# Patient Record
Sex: Female | Born: 1955 | Race: Black or African American | Hispanic: No | Marital: Married
Health system: Southern US, Community
[De-identification: ages and names within clinical notes are randomized; demographics above are authoritative.]

---

## 2004-03-11 ENCOUNTER — Ambulatory Visit (HOSPITAL_COMMUNITY): Admission: RE | Admit: 2004-03-11 | Discharge: 2004-03-11 | Payer: Self-pay | Admitting: Internal Medicine

## 2004-09-30 ENCOUNTER — Ambulatory Visit: Payer: Self-pay | Admitting: Orthopedic Surgery

## 2004-10-04 ENCOUNTER — Ambulatory Visit: Payer: Self-pay | Admitting: Family Medicine

## 2004-11-22 ENCOUNTER — Ambulatory Visit: Payer: Self-pay | Admitting: Family Medicine

## 2006-01-30 ENCOUNTER — Ambulatory Visit: Payer: Self-pay | Admitting: Family Medicine

## 2006-04-03 ENCOUNTER — Ambulatory Visit: Payer: Self-pay | Admitting: Family Medicine

## 2006-04-03 ENCOUNTER — Other Ambulatory Visit: Admission: RE | Admit: 2006-04-03 | Discharge: 2006-04-03 | Payer: Self-pay | Admitting: Family Medicine

## 2006-04-03 LAB — CONVERTED CEMR LAB
Pap Smear: NORMAL
Pap Smear: NORMAL

## 2006-11-11 ENCOUNTER — Ambulatory Visit: Payer: Self-pay | Admitting: Family Medicine

## 2009-02-01 ENCOUNTER — Encounter: Payer: Self-pay | Admitting: Family Medicine

## 2009-02-01 DIAGNOSIS — I10 Essential (primary) hypertension: Secondary | ICD-10-CM | POA: Insufficient documentation

## 2009-02-08 ENCOUNTER — Ambulatory Visit (HOSPITAL_COMMUNITY): Admission: RE | Admit: 2009-02-08 | Discharge: 2009-02-08 | Payer: Self-pay | Admitting: Family Medicine

## 2009-02-08 ENCOUNTER — Ambulatory Visit: Payer: Self-pay | Admitting: Family Medicine

## 2009-02-08 DIAGNOSIS — M25519 Pain in unspecified shoulder: Secondary | ICD-10-CM

## 2009-02-09 ENCOUNTER — Encounter: Payer: Self-pay | Admitting: Family Medicine

## 2009-02-09 LAB — CONVERTED CEMR LAB
Basophils Absolute: 0 10*3/uL (ref 0.0–0.1)
Chloride: 105 meq/L (ref 96–112)
Cholesterol: 158 mg/dL (ref 0–200)
HDL: 50 mg/dL (ref >39)
Lymphocytes Relative: 37 % (ref 12–46)
Lymphs Abs: 1.8 10*3/uL (ref 0.7–4.0)
Neutro Abs: 2.9 10*3/uL (ref 1.7–7.7)
Platelets: 315 10*3/uL (ref 150–400)
Potassium: 4.4 meq/L (ref 3.5–5.3)
RDW: 13.2 % (ref 11.5–15.5)
Sodium: 142 meq/L (ref 135–145)
Total CHOL/HDL Ratio: 3.2
Triglycerides: 106 mg/dL (ref <150)
VLDL: 21 mg/dL (ref 0–40)
WBC: 4.9 10*3/uL (ref 4.0–10.5)

## 2009-02-15 DIAGNOSIS — E663 Overweight: Secondary | ICD-10-CM | POA: Insufficient documentation

## 2009-12-20 IMAGING — CR DG SHOULDER 2+V*L*
3 series · 3 of 3 positions shown · non-contrast
Comparison: None

CLINICAL DATA: Left shoulder pain

LEFT SHOULDER - 2+ VIEW

[view not recorded (1 of 3)]
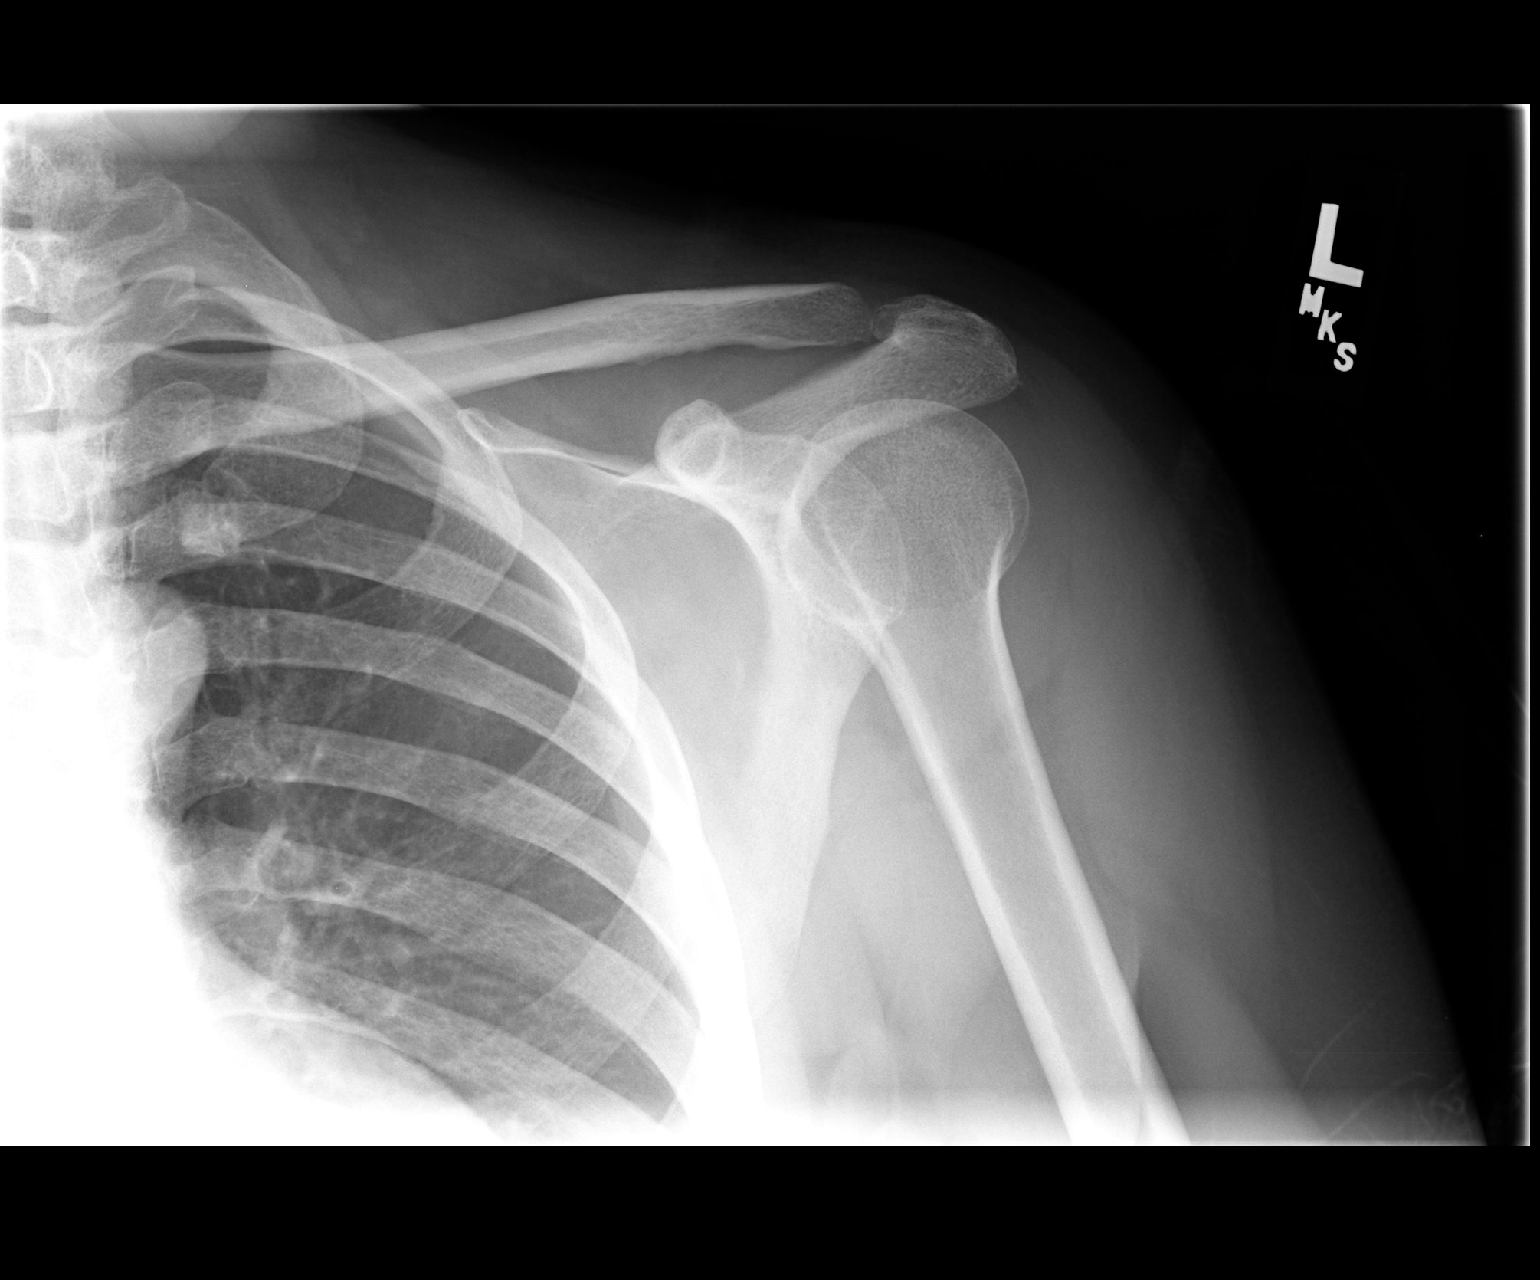

[view not recorded (2 of 3)]
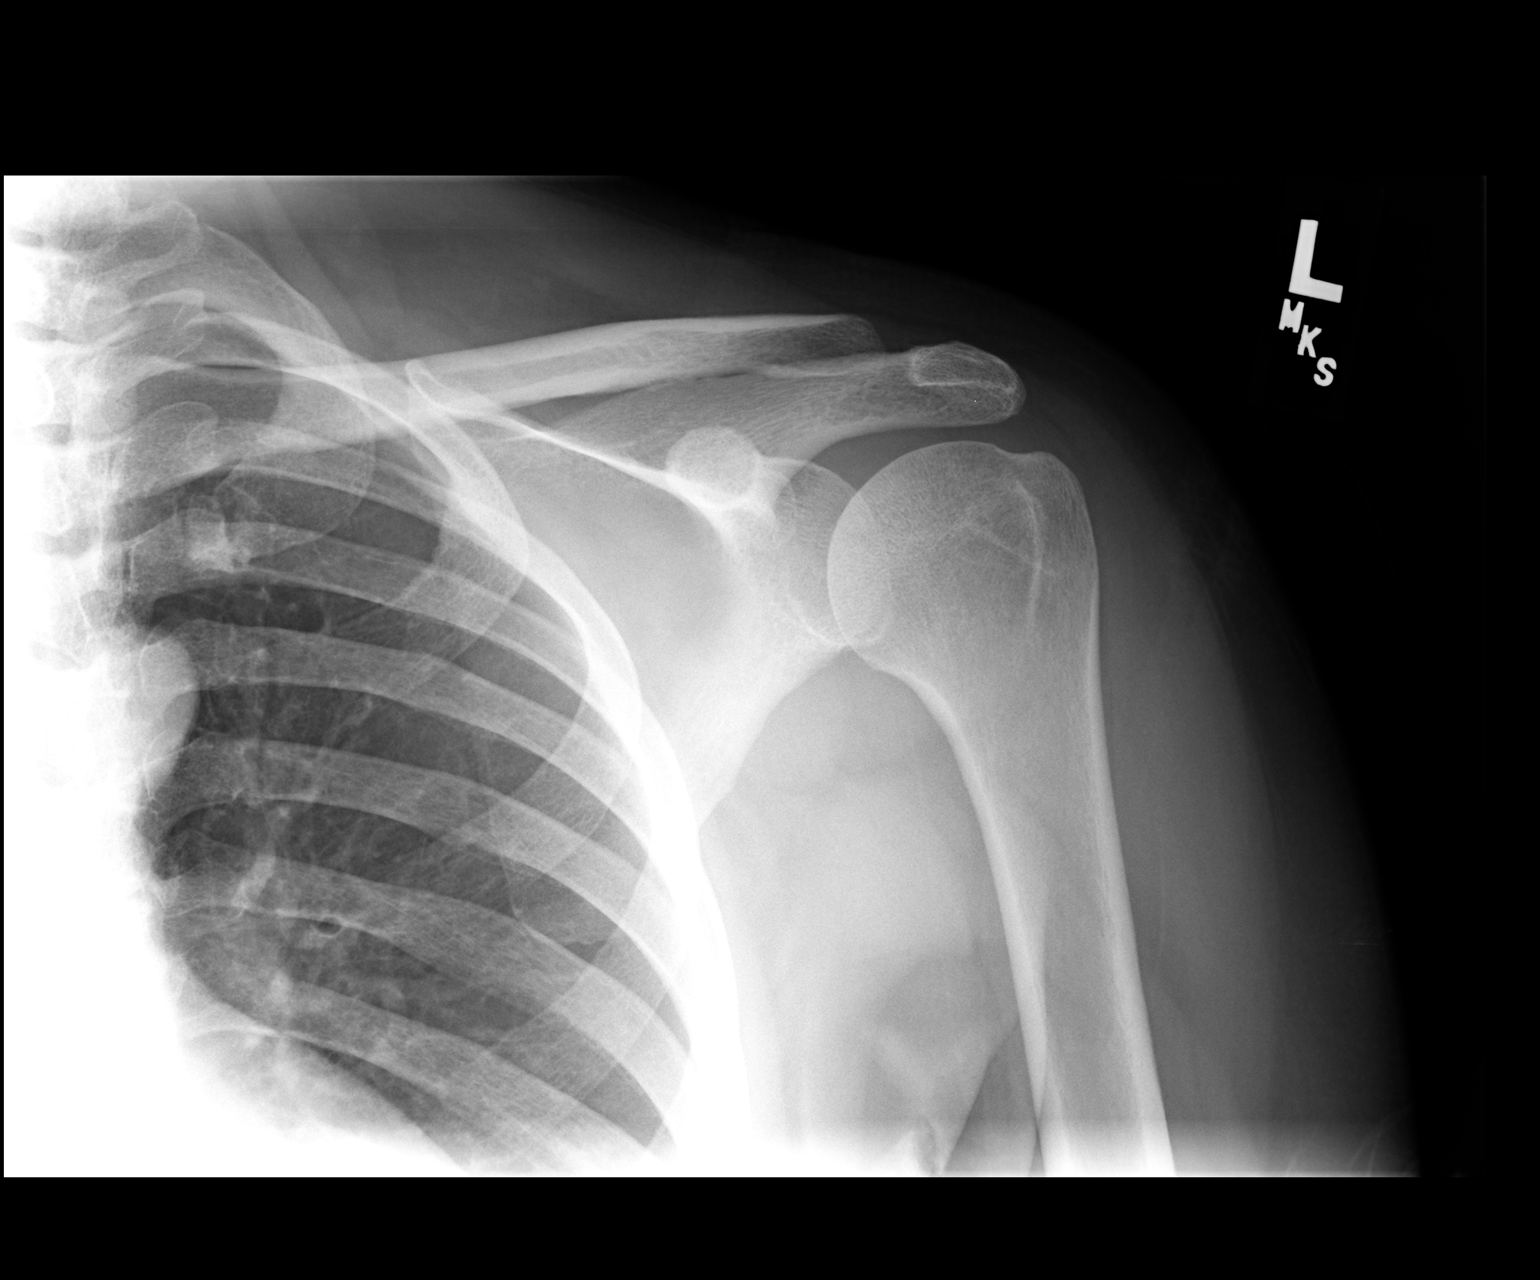

[view not recorded (3 of 3)]
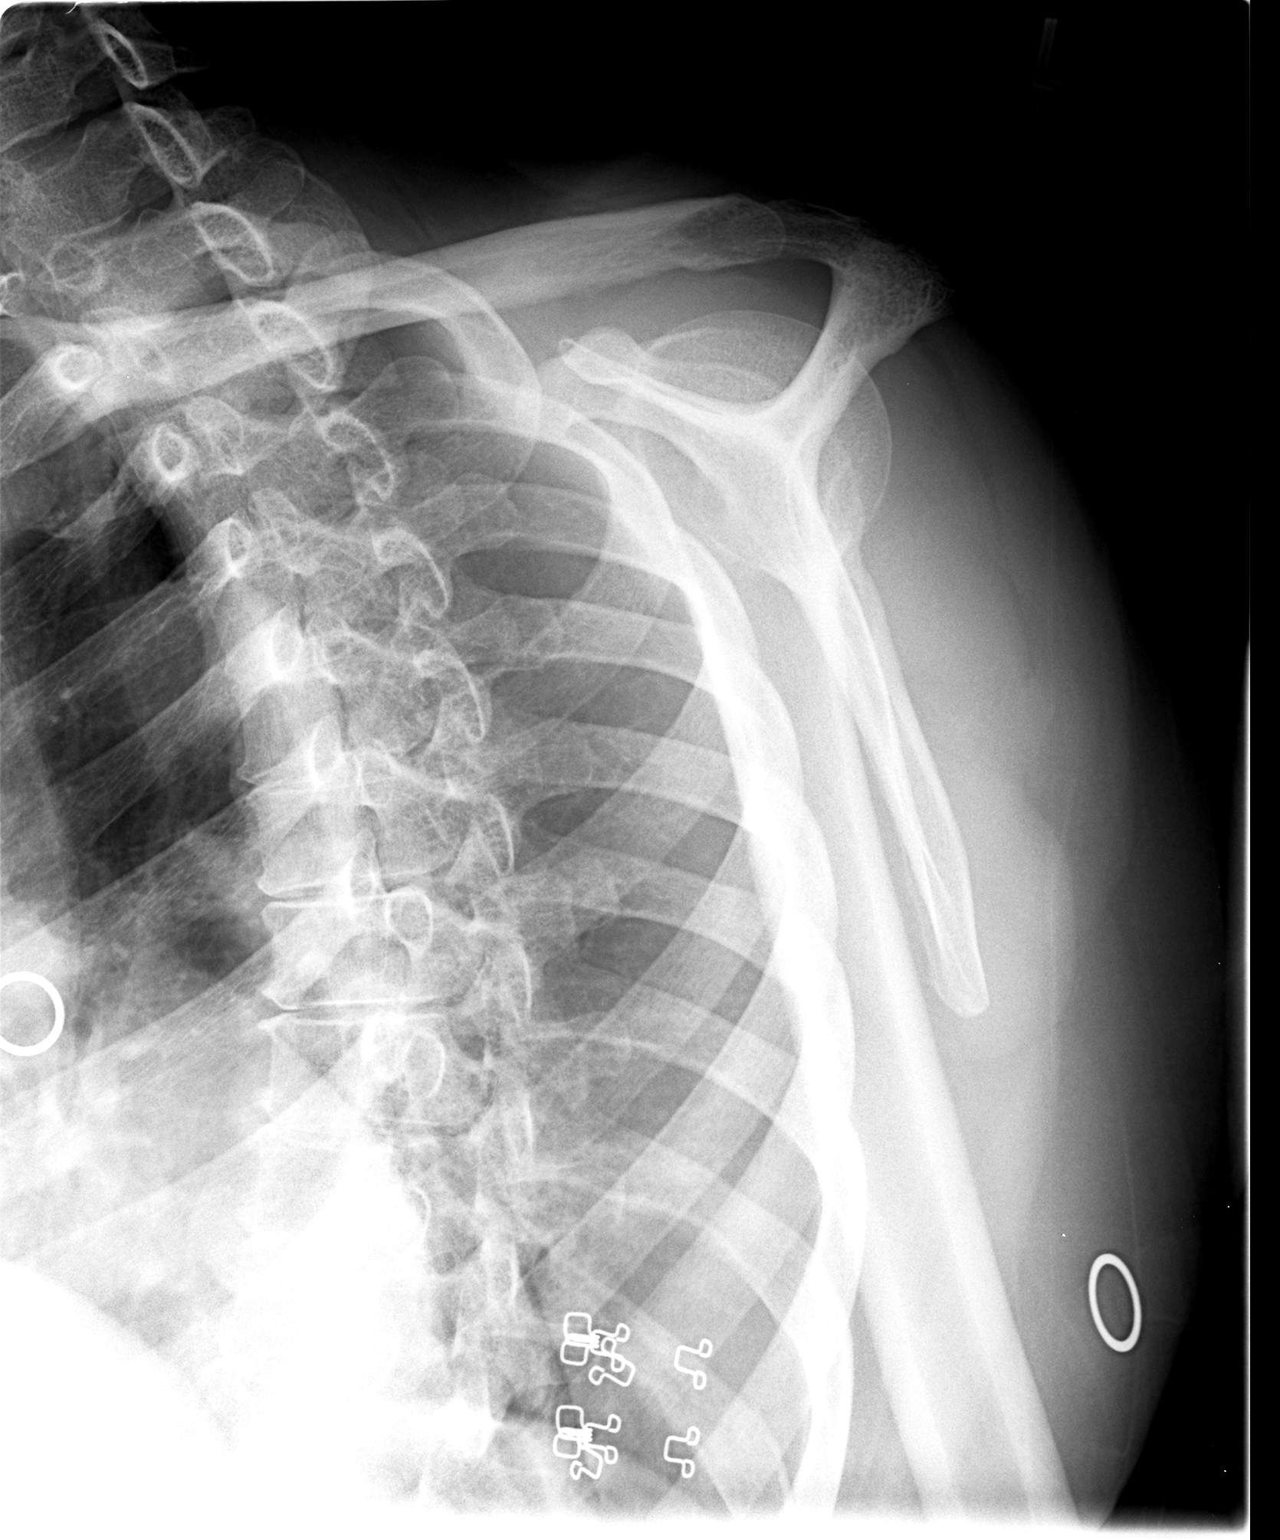

[3 of 3 positions shown; findings below may reference images not displayed]

FINDINGS: The left shoulder demonstrates normal alignment.  No
evidence of fracture, dislocation, significant degenerative changes
or soft tissue abnormalities.
IMPRESSION: Normal left shoulder.

## 2021-10-08 NOTE — Progress Notes (Shared)
[facility] - Clinic Note  10/09/2021     CHIEF COMPLAINT Patient presents for No chief complaint on file.   HISTORY OF PRESENT ILLNESS: Catherine Wagner is a 65 y.o. female who presents to the clinic today for:     Referring physician: No referring provider defined for this encounter.  HISTORICAL INFORMATION:   Selected notes from the MEDICAL RECORD NUMBER Referred by Dr. Nedra Hai:  Ocular Hx- PMH-    CURRENT MEDICATIONS: No current outpatient medications on file. (Ophthalmic Drugs)   No current facility-administered medications for this visit. (Ophthalmic Drugs)   No current outpatient medications on file. (Other)   No current facility-administered medications for this visit. (Other)      REVIEW OF SYSTEMS:    ALLERGIES Allergies  Allergen Reactions   Penicillins     PAST MEDICAL HISTORY No past medical history on file. *** The histories are not reviewed yet. Please review them in the "History" navigator section and refresh this SmartLink.  FAMILY HISTORY No family history on file.  SOCIAL HISTORY         OPHTHALMIC EXAM:  Not recorded     IMAGING AND PROCEDURES  Imaging and Procedures for 10/09/2021           ASSESSMENT/PLAN:  No diagnosis found.  1.  2.  3.  Ophthalmic Meds Ordered this visit:  No orders of the defined types were placed in this encounter.      No follow-ups on file.  There are no Patient Instructions on file for this visit.   Explained the diagnoses, plan, and follow up with the patient and they expressed understanding.  Patient expressed understanding of the importance of proper follow up care.   This document serves as a record of services personally performed by Karie Chimera, MD, PhD. It was created on their behalf by De Blanch, an ophthalmic technician. The creation of this record is the provider's dictation and/or activities during the visit.    Electronically signed  by: De Blanch, OA, 10/08/21  12:50 PM   Karie Chimera, M.D., Ph.D. Diseases & Surgery of the Retina and Vitreous [facility] @TODAY @     Abbreviations: M myopia (nearsighted); A astigmatism; H hyperopia (farsighted); P presbyopia; Mrx spectacle prescription;  CTL contact lenses; OD right eye; OS left eye; OU both eyes  XT exotropia; ET esotropia; PEK punctate epithelial keratitis; PEE punctate epithelial erosions; DES dry eye syndrome; MGD meibomian gland dysfunction; ATs artificial tears; PFAT's preservative free artificial tears; NSC nuclear sclerotic cataract; PSC posterior subcapsular cataract; ERM epi-retinal membrane; PVD posterior vitreous detachment; RD retinal detachment; DM diabetes mellitus; DR diabetic retinopathy; NPDR non-proliferative diabetic retinopathy; PDR proliferative diabetic retinopathy; CSME clinically significant macular edema; DME diabetic macular edema; dbh dot blot hemorrhages; CWS cotton wool spot; POAG primary open angle glaucoma; C/D cup-to-disc ratio; HVF humphrey visual field; GVF goldmann visual field; OCT optical coherence tomography; IOP intraocular pressure; BRVO Branch retinal vein occlusion; CRVO central retinal vein occlusion; CRAO central retinal artery occlusion; BRAO branch retinal artery occlusion; RT retinal tear; SB scleral buckle; PPV pars plana vitrectomy; VH Vitreous hemorrhage; PRP panretinal laser photocoagulation; IVK intravitreal kenalog; VMT vitreomacular traction; MH Macular hole;  NVD neovascularization of the disc; NVE neovascularization elsewhere; AREDS age related eye disease study; ARMD age related macular degeneration; POAG primary open angle glaucoma; EBMD epithelial/anterior basement membrane dystrophy; ACIOL anterior chamber intraocular lens; IOL intraocular lens; PCIOL posterior chamber intraocular lens; Phaco/IOL phacoemulsification  with intraocular lens placement; PRK photorefractive keratectomy; LASIK  laser assisted in situ keratomileusis; HTN hypertension; DM diabetes mellitus; COPD chronic obstructive pulmonary disease

## 2021-10-09 ENCOUNTER — Encounter (INDEPENDENT_AMBULATORY_CARE_PROVIDER_SITE_OTHER): Payer: Self-pay | Admitting: Ophthalmology
